# Patient Record
Sex: Male | Born: 1965 | Race: White | Hispanic: No | Marital: Married | Smoking: Current every day smoker
Health system: Southern US, Community
[De-identification: ages and names within clinical notes are randomized; demographics above are authoritative.]

---

## 2020-10-02 ENCOUNTER — Other Ambulatory Visit: Payer: Self-pay

## 2020-10-02 ENCOUNTER — Emergency Department: Payer: BC Managed Care – PPO

## 2020-10-02 ENCOUNTER — Emergency Department
Admission: EM | Admit: 2020-10-02 | Discharge: 2020-10-02 | Disposition: A | Discharge status: Home / Self Care | Payer: BC Managed Care – PPO | Attending: Emergency Medicine | Admitting: Emergency Medicine

## 2020-10-02 DIAGNOSIS — M79602 Pain in left arm: Secondary | ICD-10-CM | POA: Diagnosis not present

## 2020-10-02 DIAGNOSIS — R0789 Other chest pain: Secondary | ICD-10-CM | POA: Diagnosis not present

## 2020-10-02 DIAGNOSIS — F17290 Nicotine dependence, other tobacco product, uncomplicated: Secondary | ICD-10-CM | POA: Insufficient documentation

## 2020-10-02 DIAGNOSIS — R202 Paresthesia of skin: Secondary | ICD-10-CM | POA: Diagnosis not present

## 2020-10-02 DIAGNOSIS — R200 Anesthesia of skin: Secondary | ICD-10-CM | POA: Insufficient documentation

## 2020-10-02 DIAGNOSIS — R079 Chest pain, unspecified: Secondary | ICD-10-CM

## 2020-10-02 LAB — BASIC METABOLIC PANEL
Anion gap: 6 (ref 5–15)
BUN: 15 mg/dL (ref 6–20)
CO2: 26 mmol/L (ref 22–32)
Calcium: 9 mg/dL (ref 8.9–10.3)
Chloride: 108 mmol/L (ref 98–111)
Creatinine, Ser: 0.9 mg/dL (ref 0.61–1.24)
GFR, Estimated: >60 mL/min (ref >60)
Glucose, Bld: 155 mg/dL — ABNORMAL HIGH (ref 70–99)
Potassium: 3.6 mmol/L (ref 3.5–5.1)
Sodium: 140 mmol/L (ref 135–145)

## 2020-10-02 LAB — CBC
HCT: 45.1 % (ref 39.0–52.0)
Hemoglobin: 15.7 g/dL (ref 13.0–17.0)
MCH: 30.1 pg (ref 26.0–34.0)
MCHC: 34.8 g/dL (ref 30.0–36.0)
MCV: 86.6 fL (ref 80.0–100.0)
Platelets: 193 10*3/uL (ref 150–400)
RBC: 5.21 MIL/uL (ref 4.22–5.81)
RDW: 12.6 % (ref 11.5–15.5)
WBC: 7.8 10*3/uL (ref 4.0–10.5)
nRBC: 0 % (ref 0.0–0.2)

## 2020-10-02 LAB — TROPONIN I (HIGH SENSITIVITY)
Troponin I (High Sensitivity): 3 ng/L (ref <18)
Troponin I (High Sensitivity): 3 ng/L (ref <18)

## 2020-10-02 MED ORDER — IOHEXOL 350 MG/ML SOLN
100.0000 mL | Freq: Once | INTRAVENOUS | Status: AC | PRN
  Administered 2020-10-02: 100 mL via INTRAVENOUS
  Filled 2020-10-02: qty 100

## 2020-10-02 MED ORDER — LIDOCAINE 5 % EX PTCH: 1.0000 | MEDICATED_PATCH | Freq: Two times a day (BID) | CUTANEOUS | 0 refills | Status: AC

## 2020-10-02 NOTE — ED Triage Notes (Signed)
Pt c/o left sided chest pain that radiates into the left arm and back intermittently since satruday, states she has had periods of near syncope , SOB diaphoresis. Pt is in NAD on arrival, skin is warm and dry

## 2020-10-02 NOTE — ED Provider Notes (Signed)
[facility] Emergency Department Provider Note   ____________________________________________   Event Date/Time   First MD Initiated Contact with Patient 10/02/20 1952     (approximate)  I have reviewed the triage vital signs and the nursing notes.   HISTORY  Chief Complaint Chest Pain    HPI Tommy Russo is a 55 y.o. male with no significant past medical history who presents to the ED complaining of chest pain.  Patient reports that he has been dealing with intermittent pain in his chest for the past couple of days that became constant earlier today.  He describes it as sharp and stabbing, initially affected the right side of his chest but now seems to have moved to the left side.  It radiates towards the middle of his shoulder blades as well as down his left arm.  It seems to be exacerbated by exertion and certain positions.  It is associated with some numbness and tingling in his left arm but he denies any weakness.  He has never had similar symptoms in the past and denies any cardiac history.  He has not had any fevers, cough, shortness of breath, pain or swelling in his legs.        History reviewed. No pertinent past medical history.  There are no problems to display for this patient.   History reviewed. No pertinent surgical history.  Prior to Admission medications   Medication Sig Start Date End Date Taking? Authorizing Provider  lidocaine (LIDODERM) 5 % Place 1 patch onto the skin every 12 (twelve) hours. Remove & Discard patch within 12 hours or as directed by MD 10/02/20 10/02/21 Yes Chesley Noon, MD    Allergies Patient has no known allergies.  No family history on file.  Social History Social History   Tobacco Use   Smoking status: Every Day    Pack years: 0.00    Types: Cigars   Smokeless tobacco: Never  Substance Use Topics   Alcohol use: Not Currently   Drug use: Not Currently    Review of Systems  Constitutional:  No fever/chills Eyes: No visual changes. ENT: No sore throat. Cardiovascular: Positive for chest pain. Respiratory: Denies shortness of breath. Gastrointestinal: No abdominal pain.  No nausea, no vomiting.  No diarrhea.  No constipation. Genitourinary: Negative for dysuria. Musculoskeletal: Positive for back pain. Skin: Negative for rash. Neurological: Negative for headaches or focal weakness, positive for left arm numbness and tingling.  ____________________________________________   PHYSICAL EXAM:  VITAL SIGNS: ED Triage Vitals  Enc Vitals Group     BP 10/02/20 1842 (!) 161/96     Pulse Rate 10/02/20 1842 76     Resp 10/02/20 1842 16     Temp 10/02/20 1842 98.2 F (36.8 C)     Temp Source 10/02/20 1842 Oral     SpO2 10/02/20 1842 96 %     Weight 10/02/20 1850 215 lb (97.5 kg)     Height 10/02/20 1850 5\' 8"  (1.727 m)     Head Circumference --      Peak Flow --      Pain Score 10/02/20 1849 2     Pain Loc --      Pain Edu? --      Excl. in GC? --     Constitutional: Alert and oriented. Eyes: Conjunctivae are normal. Head: Atraumatic. Nose: No congestion/rhinnorhea. Mouth/Throat: Mucous membranes are moist. Neck: Normal ROM Cardiovascular: Normal rate, regular rhythm. Grossly normal heart sounds.  2+ radial pulses  bilaterally. Respiratory: Normal respiratory effort.  No retractions. Lungs CTAB.  With left anterior chest wall tenderness to palpation. Gastrointestinal: Soft and nontender. No distention. Genitourinary: deferred Musculoskeletal: No lower extremity tenderness nor edema.  Tenderness to palpation over left scapula. Neurologic:  Normal speech and language. No gross focal neurologic deficits are appreciated. Skin:  Skin is warm, dry and intact. No rash noted. Psychiatric: Mood and affect are normal. Speech and behavior are normal.  ____________________________________________   LABS (all labs ordered are listed, but only abnormal results are  displayed)  Labs Reviewed  BASIC METABOLIC PANEL - Abnormal; Notable for the following components:      Result Value   Glucose, Bld 155 (*)    All other components within normal limits  CBC  TROPONIN I (HIGH SENSITIVITY)  TROPONIN I (HIGH SENSITIVITY)   ____________________________________________  EKG  ED ECG REPORT I, Chesley Noon, the attending physician, personally viewed and interpreted this ECG.   Date: 10/02/2020  EKG Time: 18:40  Rate: 76  Rhythm: normal sinus rhythm  Axis: Normal  Intervals: Incomplete RBBB  ST&T Change: None   PROCEDURES  Procedure(s) performed (including Critical Care):  Procedures   ____________________________________________   INITIAL IMPRESSION / ASSESSMENT AND PLAN / ED COURSE      55 year old male with no significant past medical history presents to the ED complaining of intermittent pain in his chest that has become constant, starting on the left side and radiating towards his back with associated numbness and tingling in his left arm.  Pulses are intact to his bilateral upper extremities, but given pain radiating to his back with associated numbness, we will further assess for aortic pathology with CT angiogram.  EKG shows no evidence of arrhythmia or ischemia and initial troponin is negative, we will trend but overall low suspicion for ACS.  Given reassuring vital signs with no respiratory component, low suspicion for PE.  Chest x-ray reviewed by me and shows no infiltrate, edema, or effusion.  Patient declines pain medication at this time.  Repeat troponin is negative and I doubt ACS.  CTA results are pending at this time, if they are negative patient be appropriate for discharge home with PCP and cardiology follow-up.  If CTA is negative I suspect patient's pain is musculoskeletal in origin and he will be prescribed Lidoderm patch.      ____________________________________________   FINAL CLINICAL IMPRESSION(S) / ED  DIAGNOSES  Final diagnoses:  Nonspecific chest pain     ED Discharge Orders          Ordered    lidocaine (LIDODERM) 5 %  Every 12 hours        10/02/20 2149             Note:  This document was prepared using Dragon voice recognition software and may include unintentional dictation errors.    Chesley Noon, MD 10/02/20 2150

## 2022-08-14 IMAGING — CT CT ANGIO CHEST-ABD-PELV FOR DISSECTION W/ AND WO/W CM
2 of 7 series · 14 of 46 positions shown, 16 images · non-contrast
Comparison: None.

CLINICAL DATA: Left-sided chest pain that radiates to the back
periods of near syncope

EXAM:
CT ANGIOGRAPHY CHEST, ABDOMEN AND PELVIS
TECHNIQUE: Non-contrast CT of the chest was initially obtained.

[Series 5: axial arterial · axial · arterial · 0.91mm/px · z∈[-986,-401]mm · 11 of 225 slices shown, 13 images]
[im 15/225  soft-tissue]
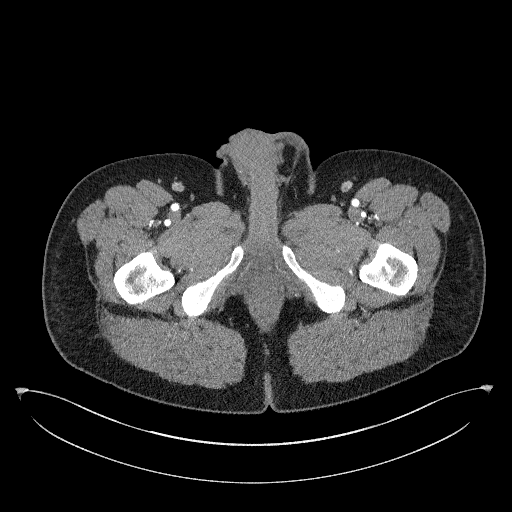
[im 15/225  bone]
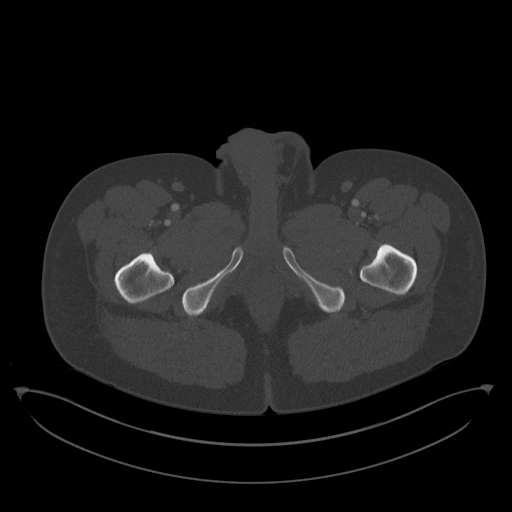
[im 30/225  soft-tissue]
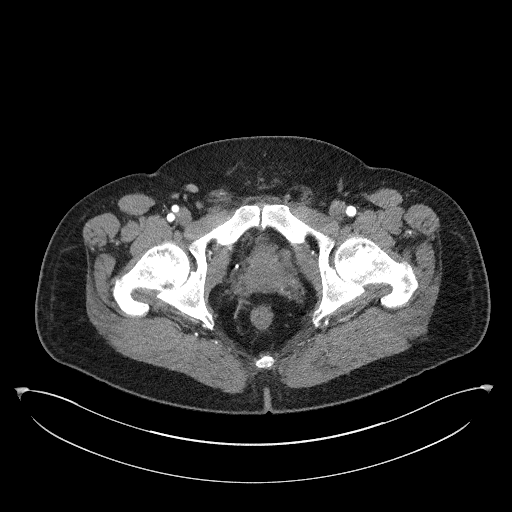
[im 60/225  soft-tissue]
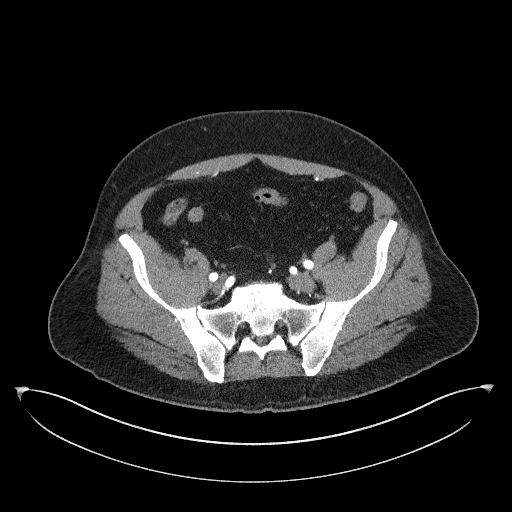
[im 75/225  soft-tissue]
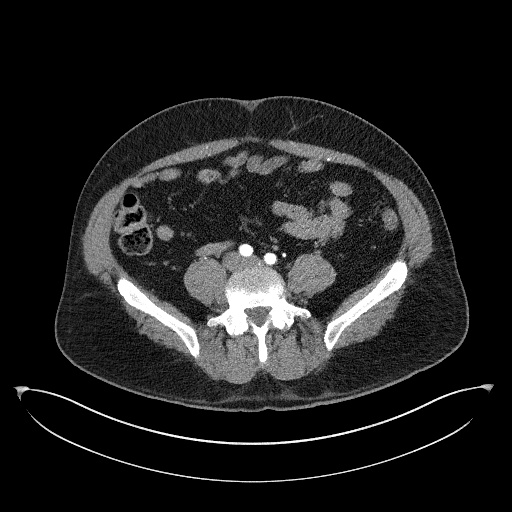
[im 90/225  soft-tissue]
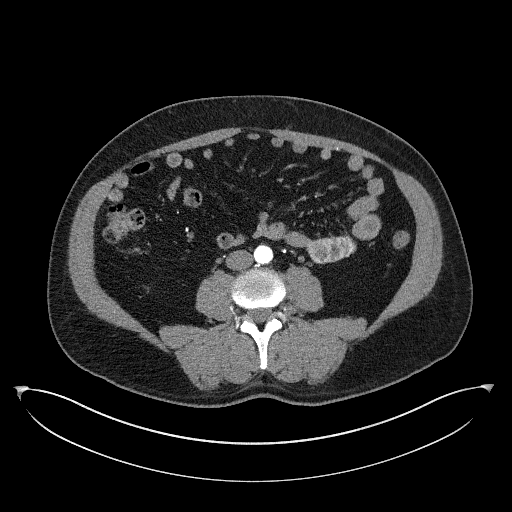
[im 120/225  soft-tissue]
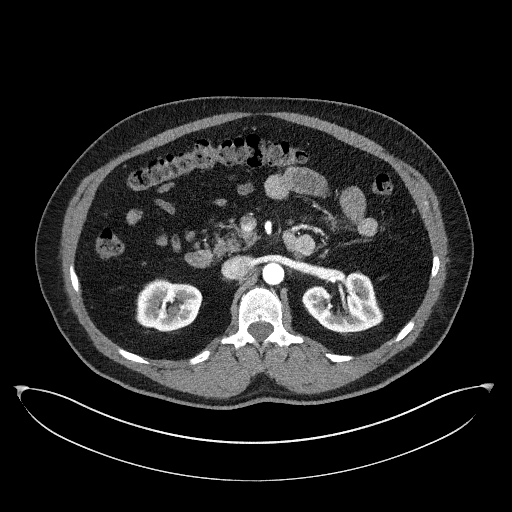
[im 135/225  soft-tissue]
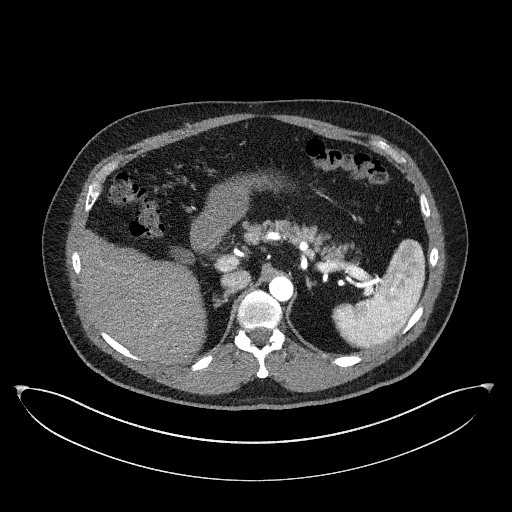
[im 150/225  soft-tissue]
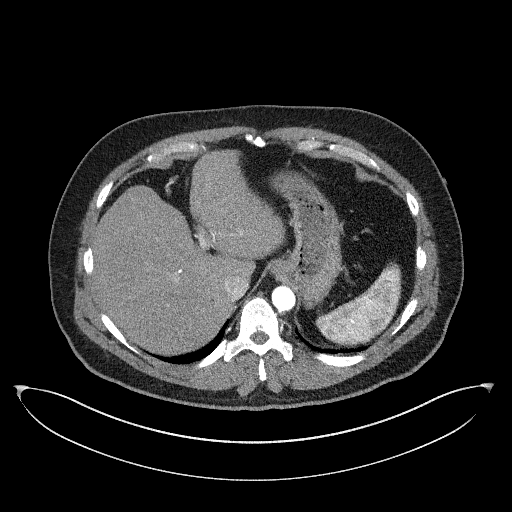
[im 165/225  soft-tissue]
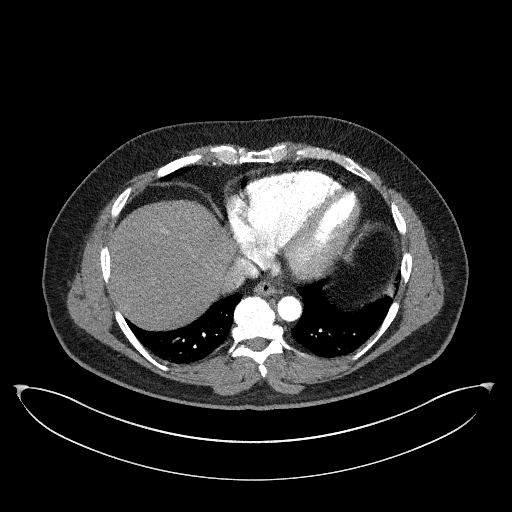
[im 165/225  bone]
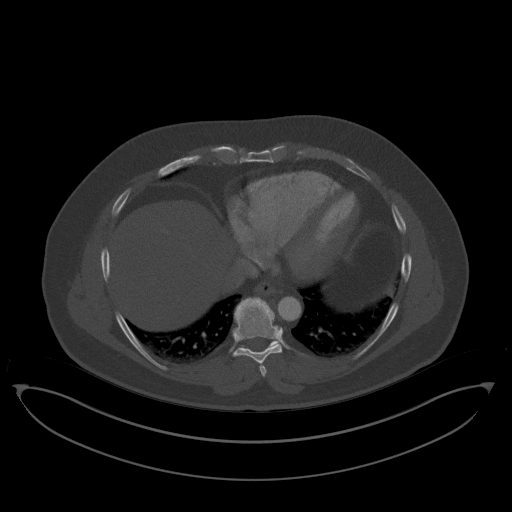
[im 195/225  soft-tissue]
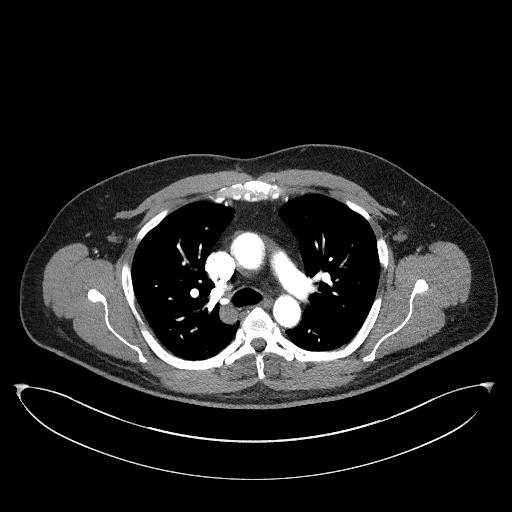
[im 210/225  soft-tissue]
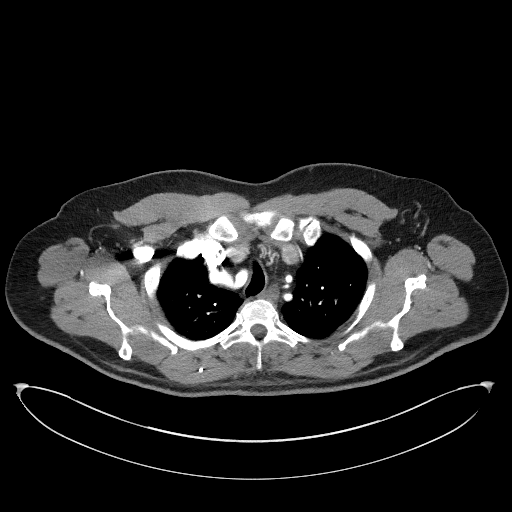

[Series 8: coronals · coronal · 0.82mm/px · 3 of 149 slices shown]
[im 38/149  soft-tissue]
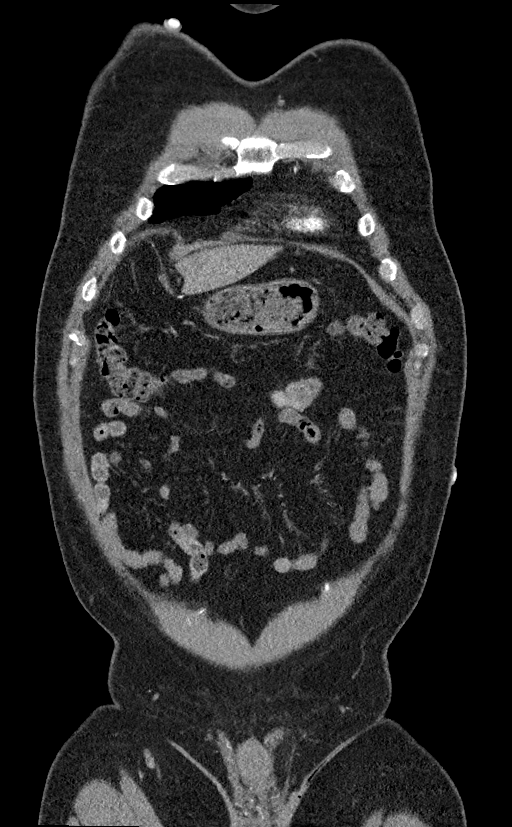
[im 75/149  soft-tissue]
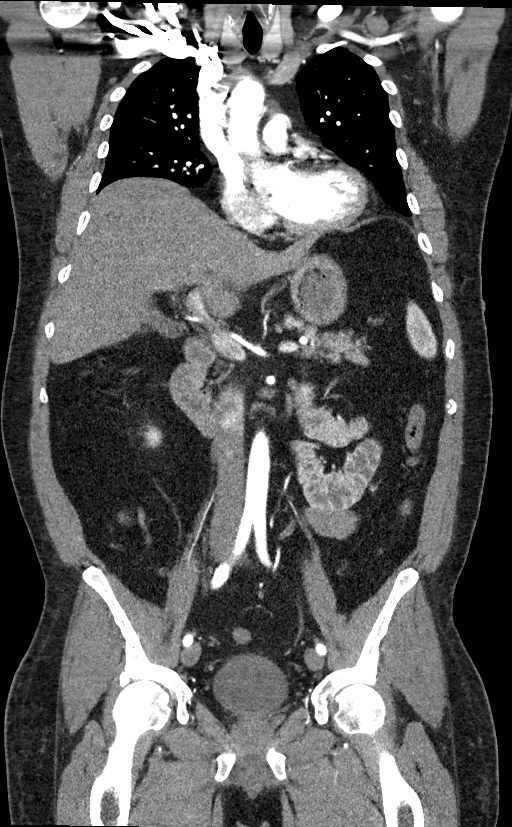
[im 112/149  soft-tissue]
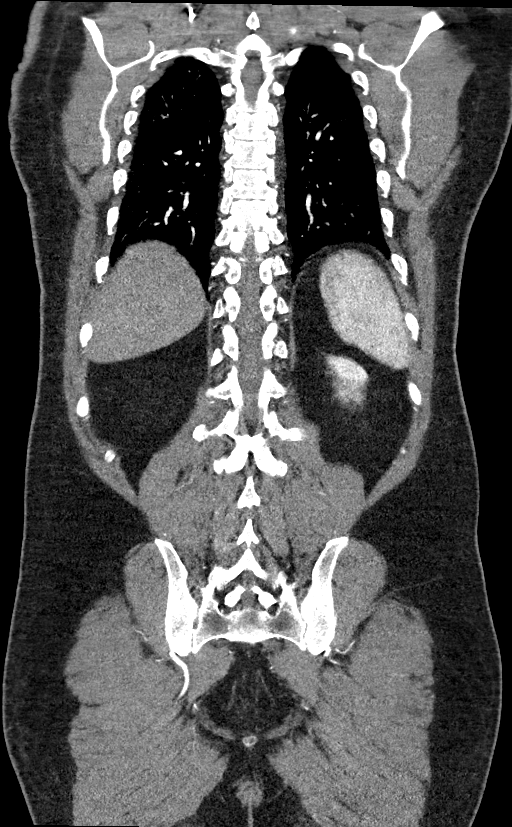

[14 of 46 positions shown; findings below may reference images not displayed]

Multidetector CT imaging through the chest, abdomen and pelvis was
performed using the standard protocol during bolus administration of
intravenous contrast. Multiplanar reconstructed images and MIPs were
obtained and reviewed to evaluate the vascular anatomy.

CONTRAST:  100mL OMNIPAQUE IOHEXOL 350 MG/ML SOLN
FINDINGS: CTA CHEST FINDINGS

Cardiovascular: No evidence intramural hematoma on noncontrast
examination. Preferential opacification of the thoracic aorta. No
evidence of thoracic aortic aneurysm or dissection. Minimal aortic
atherosclerosis. No central pulmonary embolus. Normal caliber
central pulmonary arteries. Normal heart size. No pericardial
effusion.

Mediastinum/Nodes: No discrete thyroid nodule. No pathologically
enlarged mediastinal, hilar or axillary lymph nodes. The trachea and
esophagus are grossly unremarkable.

Lungs/Pleura: No suspicious pulmonary nodules or masses. No airspace
consolidation. No pleural effusion. No pneumothorax.

Musculoskeletal: No chest wall abnormality. No acute or significant
osseous findings.

Review of the MIP images confirms the above findings.

CTA ABDOMEN AND PELVIS FINDINGS

VASCULAR

Aorta: Normal caliber aorta without aneurysm, dissection, vasculitis
or significant stenosis.

Celiac: Patent without evidence of aneurysm, dissection, vasculitis
or significant stenosis.

SMA: Patent without evidence of aneurysm, dissection, vasculitis or
significant stenosis.

Renals: Both renal arteries are patent without evidence of aneurysm,
dissection, vasculitis, fibromuscular dysplasia or significant
stenosis.

IMA: Patent without evidence of aneurysm, dissection, vasculitis or
significant stenosis.

Inflow: Patent without evidence of aneurysm, dissection, vasculitis
or significant stenosis.

Veins: No obvious venous abnormality within the limitations of this
arterial phase study.

Review of the MIP images confirms the above findings.

NON-VASCULAR

Hepatobiliary: No focal liver abnormality is seen. No gallstones,
gallbladder wall thickening, or biliary dilatation.

Pancreas: Unremarkable. No pancreatic ductal dilatation or
surrounding inflammatory changes.

Spleen: No splenic injury or perisplenic hematoma.

Adrenals/Urinary Tract: Bilateral adrenal glands are unremarkable.
No hydronephrosis. Bilateral subcentimeter hypodense renal lesions
which are technically too small to accurately characterize
statistically likely to represent cysts.

Stomach/Bowel: Stomach is within normal limits. Appendix appears
normal. No evidence of bowel wall thickening, distention, or
inflammatory changes.

Lymphatic: Pathologically enlarged abdominal or pelvic lymph nodes.

Reproductive: Prostate is unremarkable.

Other: No abdominopelvic ascites.

Musculoskeletal: L5-S1 discogenic disease. No acute osseous
abnormality.

Review of the MIP images confirms the above findings.
IMPRESSION: 1. No evidence of thoracic or abdominal aortic aneurysm or
dissection.
2. No acute findings within the chest, abdomen, or pelvis.
3.  Aortic Atherosclerosis ([id number]-864.4).
